# Patient Record
Sex: Female | Born: 1965 | Race: White | Hispanic: No | Marital: Single | Smoking: Current every day smoker
Health system: Southern US, Community
[De-identification: ages and names within clinical notes are randomized; demographics above are authoritative.]

## PROBLEM LIST (undated history)

## (undated) DIAGNOSIS — J449 Chronic obstructive pulmonary disease, unspecified: Secondary | ICD-10-CM

## (undated) DIAGNOSIS — E119 Type 2 diabetes mellitus without complications: Secondary | ICD-10-CM

## (undated) DIAGNOSIS — J45909 Unspecified asthma, uncomplicated: Secondary | ICD-10-CM

## (undated) DIAGNOSIS — I10 Essential (primary) hypertension: Secondary | ICD-10-CM

---

## 2012-10-07 ENCOUNTER — Encounter (HOSPITAL_COMMUNITY): Payer: Self-pay | Admitting: *Deleted

## 2012-10-07 ENCOUNTER — Emergency Department (HOSPITAL_COMMUNITY)
Admission: EM | Admit: 2012-10-07 | Discharge: 2012-10-07 | Disposition: A | Discharge status: Home / Self Care | Payer: Self-pay | Attending: Emergency Medicine | Admitting: Emergency Medicine

## 2012-10-07 ENCOUNTER — Emergency Department (HOSPITAL_COMMUNITY): Payer: Self-pay

## 2012-10-07 DIAGNOSIS — Y9289 Other specified places as the place of occurrence of the external cause: Secondary | ICD-10-CM | POA: Insufficient documentation

## 2012-10-07 DIAGNOSIS — I10 Essential (primary) hypertension: Secondary | ICD-10-CM | POA: Insufficient documentation

## 2012-10-07 DIAGNOSIS — Z880 Allergy status to penicillin: Secondary | ICD-10-CM | POA: Insufficient documentation

## 2012-10-07 DIAGNOSIS — J449 Chronic obstructive pulmonary disease, unspecified: Secondary | ICD-10-CM | POA: Insufficient documentation

## 2012-10-07 DIAGNOSIS — E119 Type 2 diabetes mellitus without complications: Secondary | ICD-10-CM | POA: Insufficient documentation

## 2012-10-07 DIAGNOSIS — J4489 Other specified chronic obstructive pulmonary disease: Secondary | ICD-10-CM | POA: Insufficient documentation

## 2012-10-07 DIAGNOSIS — IMO0002 Reserved for concepts with insufficient information to code with codable children: Secondary | ICD-10-CM | POA: Insufficient documentation

## 2012-10-07 DIAGNOSIS — M549 Dorsalgia, unspecified: Secondary | ICD-10-CM

## 2012-10-07 DIAGNOSIS — Y9389 Activity, other specified: Secondary | ICD-10-CM | POA: Insufficient documentation

## 2012-10-07 DIAGNOSIS — F17200 Nicotine dependence, unspecified, uncomplicated: Secondary | ICD-10-CM | POA: Insufficient documentation

## 2012-10-07 DIAGNOSIS — X503XXA Overexertion from repetitive movements, initial encounter: Secondary | ICD-10-CM | POA: Insufficient documentation

## 2012-10-07 DIAGNOSIS — Y990 Civilian activity done for income or pay: Secondary | ICD-10-CM | POA: Insufficient documentation

## 2012-10-07 HISTORY — DX: Essential (primary) hypertension: I10

## 2012-10-07 HISTORY — DX: Chronic obstructive pulmonary disease, unspecified: J44.9

## 2012-10-07 HISTORY — DX: Type 2 diabetes mellitus without complications: E11.9

## 2012-10-07 HISTORY — DX: Unspecified asthma, uncomplicated: J45.909

## 2012-10-07 MED ORDER — CYCLOBENZAPRINE HCL 10 MG PO TABS: 10.0000 mg | ORAL_TABLET | Freq: Three times a day (TID) | ORAL | Status: AC | PRN

## 2012-10-07 MED ORDER — OXYCODONE-ACETAMINOPHEN 5-325 MG PO TABS
2.0000 | ORAL_TABLET | Freq: Once | ORAL | Status: AC
  Administered 2012-10-07: 2 via ORAL
  Filled 2012-10-07: qty 2

## 2012-10-07 MED ORDER — OXYCODONE-ACETAMINOPHEN 5-325 MG PO TABS: 1.0000 | ORAL_TABLET | ORAL | Status: AC | PRN

## 2012-10-07 NOTE — ED Notes (Signed)
Pt c/o lower back pain today.  While she was working she felt something pop.  Pain since then.  She has some t 12 injury

## 2012-10-07 NOTE — ED Provider Notes (Signed)
CSN: [id number]     Arrival date & time 10/07/12  0120 History   First MD Initiated Contact with Patient 10/07/12 0133     Chief Complaint  Patient presents with  . Back Pain   HPI  History provided by the patient. The patient is a 47 year old female with history of hypertension, diabetes and COPD who presents with complaints of cutely worsened low back pain. Patient is traveling from [facility] for work. She is helping to set up the fair. Patient states that she was bending over and lifting heavy metal posts when she suddenly felt a sharp pain and heard a popping sound in her mid and low back. Since that time she has had severe pain. Injury occurred around 6-7 hours ago. Patient did take 2 Tylenol without any significant improvement of pain. No other treatments attempted. Pain does radiate some to the left lower extremity. 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 She denies any weakness or paralysis of the lower legs. Denies any perineal numbness, urinary retention, urinary or fecal incontinence. Patient states that she had an injury 3 weeks ago resulting in a T12 compression fracture of the spine. She was evaluated for this in [facility] and was given referral to followup with an orthopedic spine specialist but she has not done so due to her working schedule. She states she has had some daily back pains as a result.    Past Medical History  Diagnosis Date  . Hypertension   . Diabetes mellitus without complication   . Asthma   . COPD (chronic obstructive pulmonary disease)    History reviewed. No pertinent past surgical history. No family history on file. History  Substance Use Topics  . Smoking status: Current Every Day Smoker  . Smokeless tobacco: Not on file  . Alcohol Use: No   OB History   Grav Para Term Preterm Abortions TAB SAB Ect Mult Living                 Review of Systems  Constitutional:  Negative for fever.  Respiratory: Negative for shortness of breath.   Gastrointestinal: Negative for nausea and vomiting.  Genitourinary: Negative for dysuria, frequency, hematuria and flank pain.  Musculoskeletal: Positive for back pain.  Neurological: Negative for weakness and numbness.  All other systems reviewed and are negative.    Allergies  Penicillins  Home Medications  No current outpatient prescriptions on file. BP 137/81  Pulse 103  Temp(Src) 99 F (37.2 C) (Oral)  Resp 18  Ht 5\' 7"  (1.702 m)  Wt 205 lb (92.987 kg)  BMI 32.1 kg/m2  SpO2 99% Physical Exam  Nursing note and vitals reviewed. Constitutional: She is oriented to person, place, and time. She appears well-developed and well-nourished. No distress.  HENT:  Head: Normocephalic.  Cardiovascular: Normal rate and regular rhythm.   Pulmonary/Chest: Effort normal and breath sounds normal. No respiratory distress. She has no wheezes. She has no rales.  Abdominal: Soft. There is no tenderness.  Musculoskeletal:       Lumbar back: She exhibits tenderness. She exhibits no bony tenderness.       Back:  Patient also complains of pain to palpation of the lower extremities due to a "neuropathy". No deformities. Normal pulses. Normal and equal strength in the feet and lower legs bilaterally.  Neurological: She is alert and oriented to person, place, and time.  Skin: Skin is warm and dry. No rash  noted.  Psychiatric: She has a normal mood and affect. Her behavior is normal.    ED Course  Procedures   Imaging Review Dg Thoracic Spine W/swimmers  10/07/2012   *RADIOLOGY REPORT*  Clinical Data: Low back pain.  History of T12 injury.  THORACIC SPINE - 2 VIEW + SWIMMERS  Comparison: None.  Findings: Mild thoracolumbar curvature convex towards the left, likely positional. Normal alignment of the thoracic spine.  Old appearing compression deformity of T12.  No additional compression deformities.  No focal bone lesion or  bone destruction.  Bone cortex and trabecular architecture appear intact.  No paraspinal soft tissue swelling.  IMPRESSION: Old compression deformity of T12.  Normal alignment of the thoracic spine.  No acute fractures demonstrated.   Original Report Authenticated By: Burman Nieves, M.D.   Dg Lumbar Spine Complete  10/07/2012   *RADIOLOGY REPORT*  Clinical Data: Back pain.  Injury lifting poles.  History of T12 injury.  LUMBAR SPINE - COMPLETE 4+ VIEW  Comparison: None.  Findings: Four lumbar type vertebral bodies with sacralized L5. Lumbar convexity towards the left may be positional or degenerative.  Normal alignment of the facet joints.  Chronic- appearing anterior compression of T12.  No additional compression deformities.  Degenerative disc disease at L4-5.  No focal bone lesion or bone destruction.  IMPRESSION: Old anterior compression deformity of T12.  Degenerative changes in the lumbar spine.  No displaced fractures identified.   Original Report Authenticated By: Burman Nieves, M.D.    MDM   1. Back pain       2:05 AM patient seen and evaluated. Patient appears uncomfortable no acute distress. Secretary is working on obtaining recent x-ray results from [place].   Medical records were obtained from recent visits to [facility] in [place]. Patient was seen on September 2 for complaints of back pain. There is no documents showing any thoracic spine x-rays. She did have a chest x-ray that did not mention any compression fractures. Patient was given prescriptions for oxycodone 5/325 x 30 tabs. There was mention of her past history of polysubstance abuse however provider at that time felt there was no specific drug seeking behavior and so a narcotic prescription was provided to help with her back pains.  Plan to obtain x-rays here for evaluation.  Angus Seller, PA-C 10/07/12 [phone]

## 2012-10-12 ENCOUNTER — Emergency Department (HOSPITAL_COMMUNITY)
Admission: EM | Admit: 2012-10-12 | Discharge: 2012-10-13 | Disposition: A | Discharge status: Home / Self Care | Payer: Self-pay | Attending: Emergency Medicine | Admitting: Emergency Medicine

## 2012-10-12 ENCOUNTER — Encounter (HOSPITAL_COMMUNITY): Payer: Self-pay | Admitting: Emergency Medicine

## 2012-10-12 DIAGNOSIS — IMO0002 Reserved for concepts with insufficient information to code with codable children: Secondary | ICD-10-CM | POA: Insufficient documentation

## 2012-10-12 DIAGNOSIS — R600 Localized edema: Secondary | ICD-10-CM

## 2012-10-12 DIAGNOSIS — Z792 Long term (current) use of antibiotics: Secondary | ICD-10-CM | POA: Insufficient documentation

## 2012-10-12 DIAGNOSIS — M545 Low back pain, unspecified: Secondary | ICD-10-CM | POA: Insufficient documentation

## 2012-10-12 DIAGNOSIS — E669 Obesity, unspecified: Secondary | ICD-10-CM | POA: Insufficient documentation

## 2012-10-12 DIAGNOSIS — R609 Edema, unspecified: Secondary | ICD-10-CM | POA: Insufficient documentation

## 2012-10-12 DIAGNOSIS — I10 Essential (primary) hypertension: Secondary | ICD-10-CM | POA: Insufficient documentation

## 2012-10-12 DIAGNOSIS — J4489 Other specified chronic obstructive pulmonary disease: Secondary | ICD-10-CM | POA: Insufficient documentation

## 2012-10-12 DIAGNOSIS — G8929 Other chronic pain: Secondary | ICD-10-CM | POA: Insufficient documentation

## 2012-10-12 DIAGNOSIS — F17200 Nicotine dependence, unspecified, uncomplicated: Secondary | ICD-10-CM | POA: Insufficient documentation

## 2012-10-12 DIAGNOSIS — E119 Type 2 diabetes mellitus without complications: Secondary | ICD-10-CM | POA: Insufficient documentation

## 2012-10-12 DIAGNOSIS — J449 Chronic obstructive pulmonary disease, unspecified: Secondary | ICD-10-CM | POA: Insufficient documentation

## 2012-10-12 DIAGNOSIS — Z79899 Other long term (current) drug therapy: Secondary | ICD-10-CM | POA: Insufficient documentation

## 2012-10-12 DIAGNOSIS — Z880 Allergy status to penicillin: Secondary | ICD-10-CM | POA: Insufficient documentation

## 2012-10-12 NOTE — ED Notes (Signed)
Pt presents with left leg swelling onset today- denies any injury.  Ambulatory in triage.

## 2012-10-13 ENCOUNTER — Emergency Department (HOSPITAL_COMMUNITY): Payer: Self-pay

## 2012-10-13 LAB — POCT I-STAT, CHEM 8
Creatinine, Ser: 0.9 mg/dL (ref 0.50–1.10)
Hemoglobin: 10.5 g/dL — ABNORMAL LOW (ref 12.0–15.0)
Potassium: 3.1 mEq/L — ABNORMAL LOW (ref 3.5–5.1)
Sodium: 140 mEq/L (ref 135–145)
TCO2: 27 mmol/L (ref 0–100)

## 2012-10-13 MED ORDER — OXYCODONE-ACETAMINOPHEN 5-325 MG PO TABS
2.0000 | ORAL_TABLET | Freq: Once | ORAL | Status: AC
  Administered 2012-10-13: 2 via ORAL
  Filled 2012-10-13: qty 2

## 2012-10-13 MED ORDER — KETOROLAC TROMETHAMINE 60 MG/2ML IM SOLN
60.0000 mg | Freq: Once | INTRAMUSCULAR | Status: AC
  Administered 2012-10-13: 60 mg via INTRAMUSCULAR
  Filled 2012-10-13: qty 2

## 2012-10-13 MED ORDER — ENOXAPARIN SODIUM 100 MG/ML ~~LOC~~ SOLN
95.0000 mg | Freq: Once | SUBCUTANEOUS | Status: AC
  Administered 2012-10-13: 95 mg via SUBCUTANEOUS
  Filled 2012-10-13: qty 1

## 2012-10-13 NOTE — ED Notes (Signed)
Pt denies any questions upon discharge. 

## 2012-10-13 NOTE — ED Provider Notes (Signed)
CSN: [id number]     Arrival date & time 10/12/12  2335 History   First MD Initiated Contact with Patient 10/12/12 2343     Chief Complaint  Patient presents with  . Leg Pain   (Consider location/radiation/quality/duration/timing/severity/associated sxs/prior Treatment) HPI History provided by pt.   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  Associated w/ edema of left lower leg and ankle.  Denies fever, bowel/bladder dysfunction and LE weakness/paresthesias.  Denies trauma but reports that she was diagnosed w/ a spontaneous thoracic spine compression fx 1 month ago.  RF for DVT include frequent travel.  Has chronic low back pain but it has never felt like this; more severe and sharp and has never been associated w/ edema.  Per prior chart, pt seen on 9/11 for low back pain as well.  [place], PA-C obtained records from [facility] in [place]; pt evaluated for low back pain 09/28/12, there was no mention of thoracic spine xrays but CXR did not show compression fx, given script for 30 percocet despite mention of h/o polysubstance abuse.   Past Medical History  Diagnosis Date  . Hypertension   . Diabetes mellitus without complication   . Asthma   . COPD (chronic obstructive pulmonary disease)    History reviewed. No pertinent past surgical history. No family history on file. History  Substance Use Topics  . Smoking status: Current Every Day Smoker  . Smokeless tobacco: Not on file  . Alcohol Use: No   OB History   Grav Para Term Preterm Abortions TAB SAB Ect Mult Living                 Review of Systems  All other systems reviewed and are negative.    Allergies  Penicillins  Home Medications   Current Outpatient Rx  Name  Route  Sig  Dispense  Refill  . albuterol (PROVENTIL HFA;VENTOLIN HFA) 108 (90 BASE) MCG/ACT inhaler   Inhalation  Inhale 2 puffs into the lungs every 6 (six) hours as needed for wheezing or shortness of breath.         . cyclobenzaprine (FLEXERIL) 10 MG tablet   Oral   Take 1 tablet (10 mg total) by mouth 3 (three) times daily as needed for muscle spasms.   30 tablet   0   . Fluticasone-Salmeterol (ADVAIR) 500-50 MCG/DOSE AEPB   Inhalation   Inhale 1 puff into the lungs every 12 (twelve) hours.         . gabapentin (NEURONTIN) 300 MG capsule   Oral   Take 600 mg by mouth 3 (three) times daily.         Marland Kitchen lisinopril-hydrochlorothiazide (PRINZIDE,ZESTORETIC) 20-25 MG per tablet   Oral   Take 1 tablet by mouth daily.         . metFORMIN (GLUCOPHAGE) 500 MG tablet   Oral   Take 500 mg by mouth 2 (two) times daily with a meal.         . oxyCODONE-acetaminophen (PERCOCET) 5-325 MG per tablet   Oral   Take 1 tablet by mouth every 4 (four) hours as needed for pain.   10 tablet   0   . potassium chloride SA (K-DUR,KLOR-CON) 20 MEQ tablet   Oral   Take 40 mEq by mouth 2 (two) times daily.         Marland Kitchen  tiotropium (SPIRIVA) 18 MCG inhalation capsule   Inhalation   Place 18 mcg into inhaler and inhale daily.         Marland Kitchen doxycycline (VIBRA-TABS) 100 MG tablet   Oral   Take 100 mg by mouth 2 (two) times daily.          BP 142/78  Pulse 102  Temp(Src) 97.6 F (36.4 C) (Oral)  Resp 18  SpO2 99% Physical Exam  Nursing note and vitals reviewed. Constitutional: She is oriented to person, place, and time. She appears well-developed and well-nourished.  obese  HENT:  Head: Normocephalic and atraumatic.  Eyes:  Normal appearance  Neck: Normal range of motion.  Cardiovascular: Normal rate and regular rhythm.   Pulmonary/Chest: Effort normal and breath sounds normal.  Genitourinary:  No CVA ttp  Musculoskeletal:  Lower thoracic and lumbar spinal and paraspinal ttp. Full active ROM of LE.  Nml patellar reflexes.  No saddle anesthesia. Distal sensation intact.  Edema L calf.   Tenderness calf and entire L foot and ankle.  Pain w/ passive ROM of ankle.  2+ DP pulses.    Neurological: She is alert and oriented to person, place, and time.  Skin: Skin is warm and dry. No rash noted.  Psychiatric: She has a normal mood and affect. Her behavior is normal.    ED Course  Procedures (including critical care time) Labs Review Labs Reviewed  POCT I-STAT, CHEM 8 - Abnormal; Notable for the following:    Potassium 3.1 (*)    BUN <3 (*)    Glucose, Bld 140 (*)    Calcium, Ion 1.26 (*)    Hemoglobin 10.5 (*)    HCT 31.0 (*)    All other components within normal limits   Imaging Review Dg Thoracic Spine W/swimmers  10/13/2012   CLINICAL DATA:  Low back pain. No known injury.  EXAM: THORACIC SPINE - 2 VIEW + SWIMMERS  COMPARISON:  10/07/2012.  FINDINGS: Unchanged appearance of remote T12 compression deformity, with less than 50% height loss. No acute fracture or subluxation detected. No focally advanced degenerative disc narrowing.  IMPRESSION: 1. Negative for acute osseous injury. 2. Remote T12 compression fracture.   Electronically Signed   By: Tiburcio Pea   On: 10/13/2012 02:12   Dg Lumbar Spine Complete  10/13/2012   CLINICAL DATA:  Low back pain. No known injury  EXAM: LUMBAR SPINE - COMPLETE 4+ VIEW  COMPARISON:  10/07/2012  FINDINGS: Remote T12 compression fracture with less than 50% height loss. No subluxation. No acute fracture detected.  Transitional lumbosacral anatomy with partly sacralized L5, which has an articulation between the right transverse process and sacrum. L4-5 degenerative disc disease with narrowing and endplate sclerosis.  IMPRESSION: 1. No evidence of acute osseous injury. 2. Remote T12 compression fracture.   Electronically Signed   By: Tiburcio Pea   On: 10/13/2012 02:11    MDM   1. Leg edema, left   2. Chronic low back pain    46yo F presents w/ acute on chronic, non-traumatic low back pain.  Reports that the pain is more severe and  sharp than typical and for the first time, it is associated w/ LLE edema.  Also reports non-traumatic thoracic compression fractures one month ago, diagnosed w/ [place].  For this reason, as well as tenderness of thoracic and lumbar spinal tenderness on exam today, xrays ordered and are currently in process.  I realized afterwards that patient was evaluated for same in ED on  10/07/12, xrays were obtained at that time and were non-acute.  Pt failed to mention.  Past medical records reviewed at last visit showed that patient has had multiple recent visits for back pain in [place] and that she has a h/o polysubstance abuse.  She has no NV deficits on her exam and she is ambulatory. She received a dose of IM toradol, which she was unhappy with, as well as 2 percocet for pain, and I will d/c home w/out analgesics.  She did receive a dose of lovenox in ED because exam concerning for DVT LLE and pt at risk w/ recent travel.  Will order venous duplex for am, though I doubt patient will return.  I explained the risks of her not having this study performed, including PE and death.  1:56 AM   Xrays non-acute.  Results discussed w/ pt.  I explained to her why I will not be sending her home w/ narcotics.  Her response was, "well then I'm just going to end up back in here tomorrow".  She reports no relief w/ toradol and percocet.  I reminded her of importance of returning for LE doppler tomorrow.  Exam has been ordered.   She will return sooner for CP/SOB.    Otilio Miu, PA-C 10/13/12 [phone]

## 2012-10-13 NOTE — ED Provider Notes (Signed)
Medical screening examination/treatment/procedure(s) were performed by non-physician practitioner and as supervising physician I was immediately available for consultation/collaboration.   Lyanne Co, MD 10/13/12 2107

## 2012-10-14 NOTE — ED Provider Notes (Signed)
Medical screening examination/treatment/procedure(s) were performed by non-physician practitioner and as supervising physician I was immediately available for consultation/collaboration.   Genene Kilman, MD 10/14/12 0653 

## 2012-10-15 ENCOUNTER — Emergency Department (HOSPITAL_COMMUNITY)
Admission: EM | Admit: 2012-10-15 | Discharge: 2012-10-15 | Discharge status: Against Medical Advice | Payer: Self-pay | Attending: Emergency Medicine | Admitting: Emergency Medicine

## 2012-10-15 DIAGNOSIS — R079 Chest pain, unspecified: Secondary | ICD-10-CM | POA: Insufficient documentation

## 2012-10-15 DIAGNOSIS — F17200 Nicotine dependence, unspecified, uncomplicated: Secondary | ICD-10-CM | POA: Insufficient documentation

## 2012-10-15 NOTE — ED Notes (Signed)
Arrived via EMS, Chest pain starting this evening about ago. Described as pressure mid chest radiates towards left arm. Previous hx of Septal infarction.

## 2012-10-15 NOTE — ED Notes (Signed)
IV access attempted x2 without success. Patient also had 2 attempts via EMS.

## 2012-10-15 NOTE — ED Notes (Signed)
IV team called to attempt IV access.

## 2014-10-16 IMAGING — CR DG THORACIC SPINE 3V
3 series · 3 of 3 positions shown · non-contrast
Comparison: None.

CLINICAL DATA: Low back pain.  History of T12 injury.

THORACIC SPINE - 2 VIEW + SWIMMERS

[t thoracic spine ap]
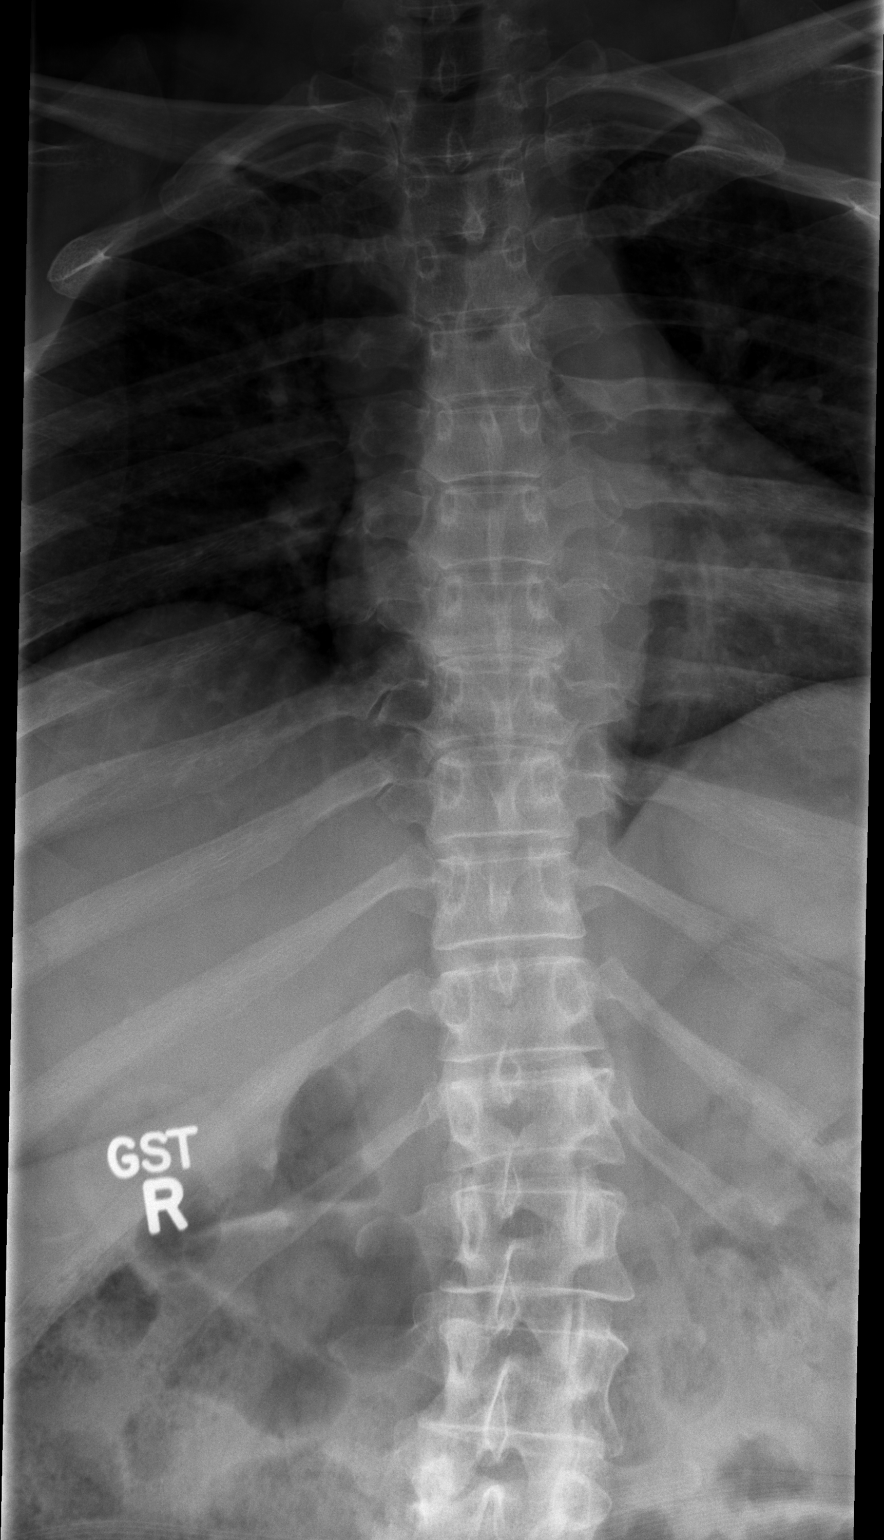

[t thoracic spine lat]
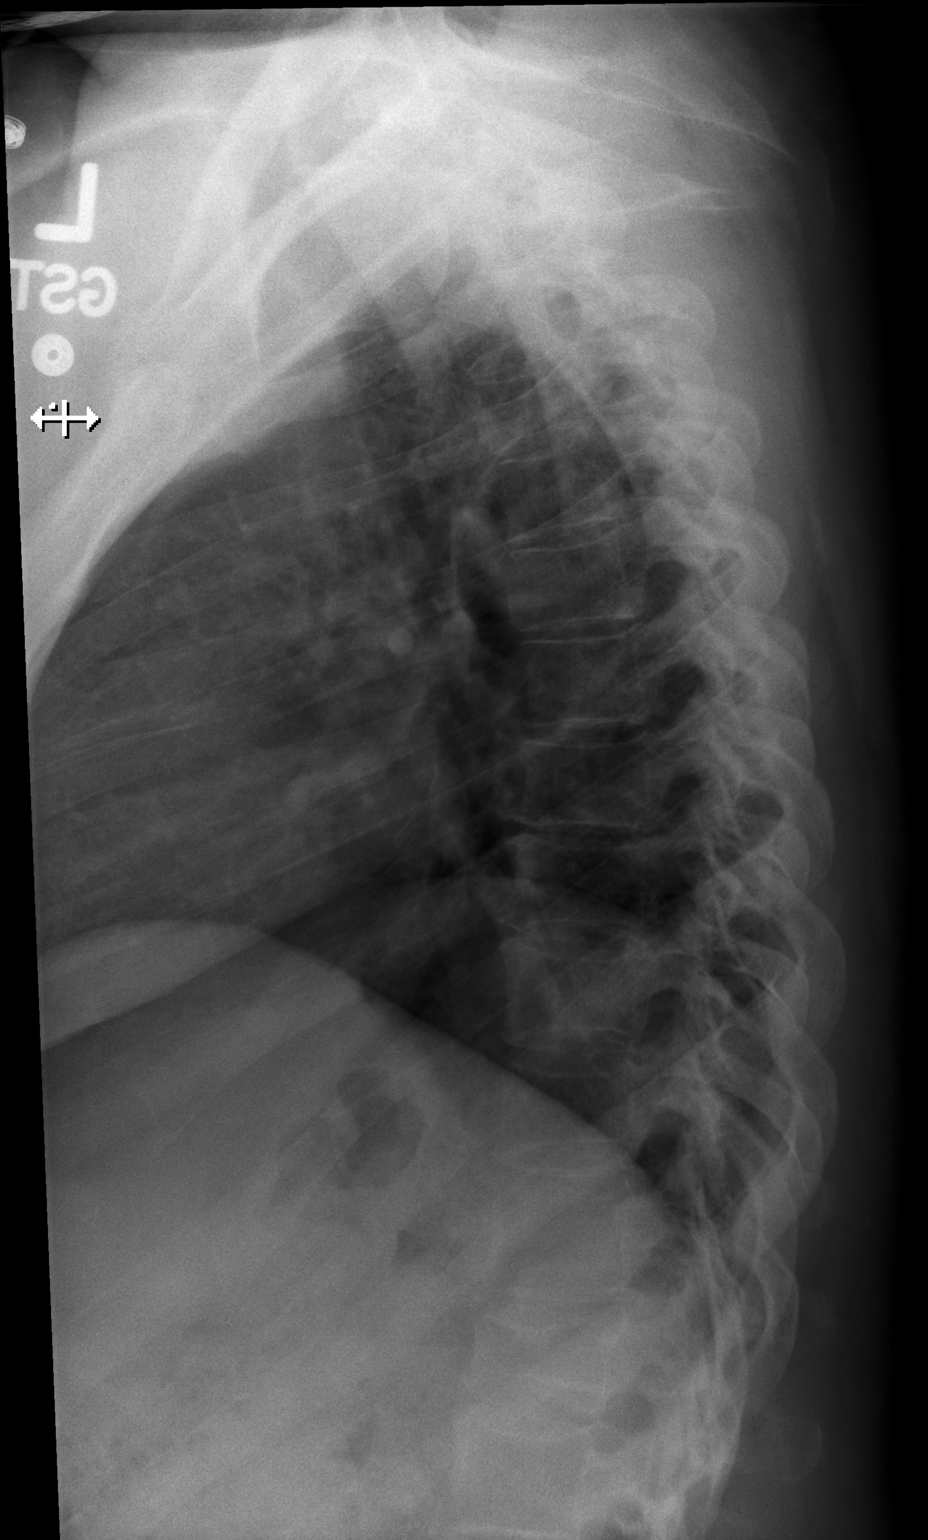

[t thoracic swimmers]
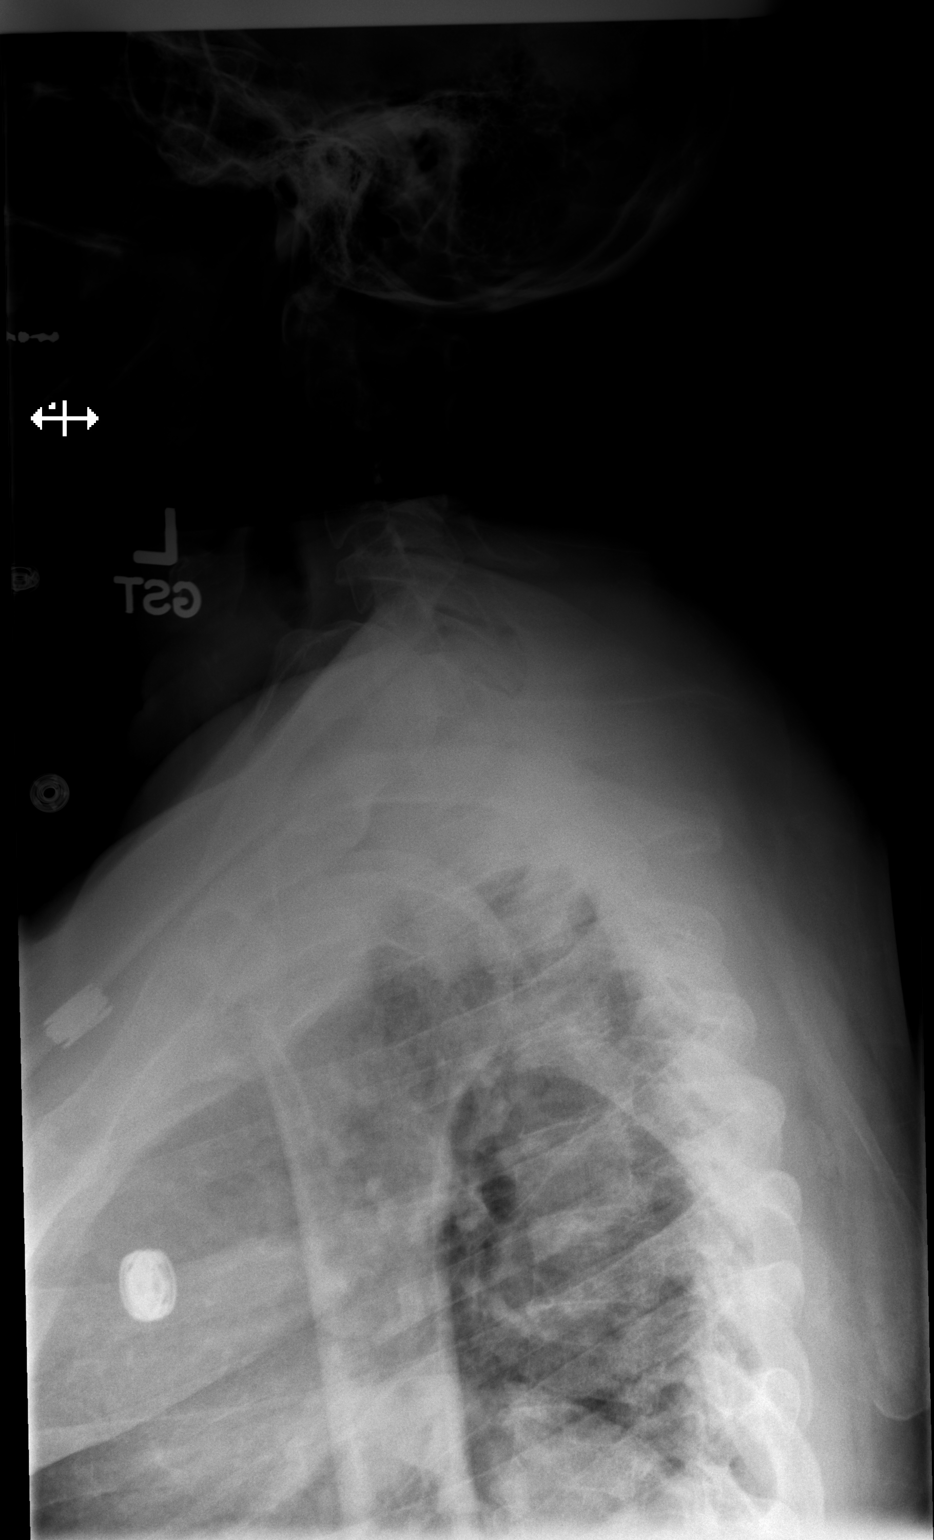

[3 of 3 positions shown; findings below may reference images not displayed]

FINDINGS: Mild thoracolumbar curvature convex towards the left,
likely positional. Normal alignment of the thoracic spine.  Old
appearing compression deformity of T12.  No additional compression
deformities.  No focal bone lesion or bone destruction.  Bone
cortex and trabecular architecture appear intact.  No paraspinal
soft tissue swelling.
IMPRESSION: Old compression deformity of T12.  Normal alignment of the thoracic
spine.  No acute fractures demonstrated.

## 2014-10-22 IMAGING — CR DG LUMBAR SPINE COMPLETE 4+V
5 series · 5 of 5 positions shown · non-contrast
Comparison: 10/07/2012

CLINICAL DATA: Low back pain. No known injury

EXAM:
LUMBAR SPINE - COMPLETE 4+ VIEW

[t lumbar spine ap]
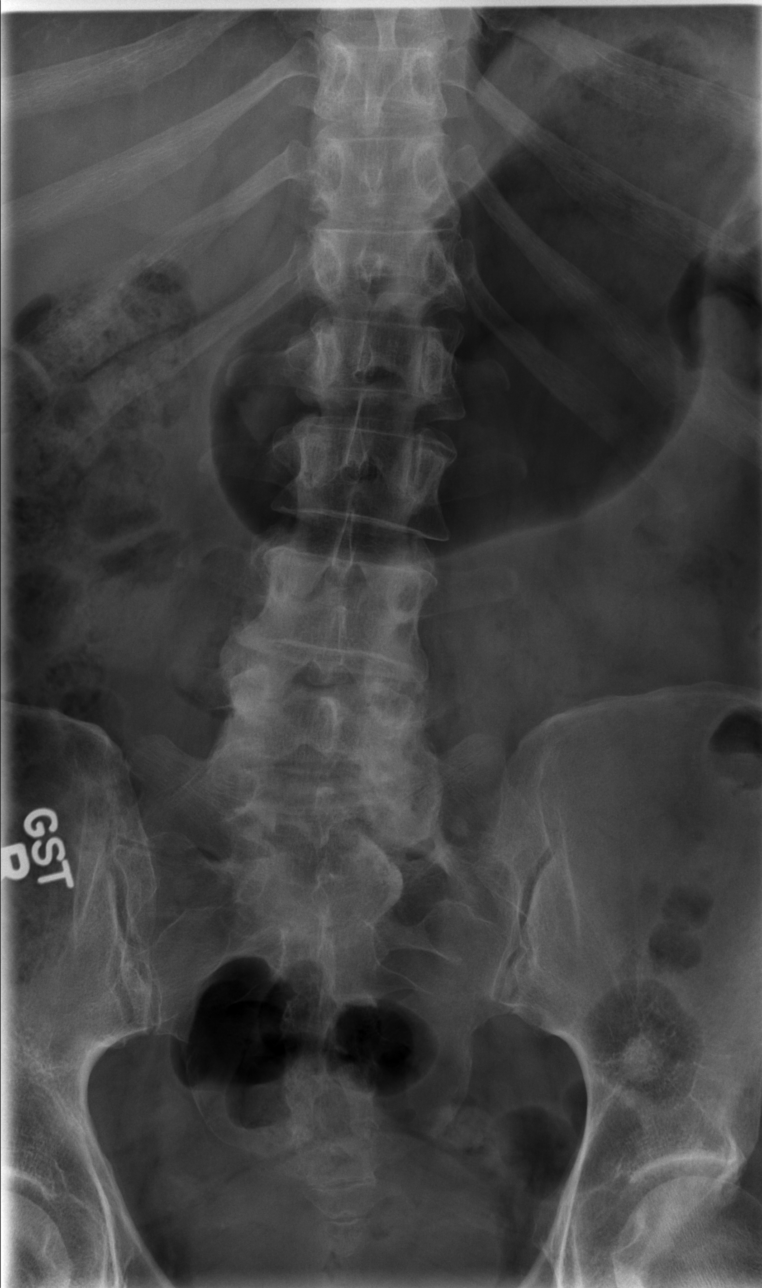

[t lumbar spine obl (1 of 2)]
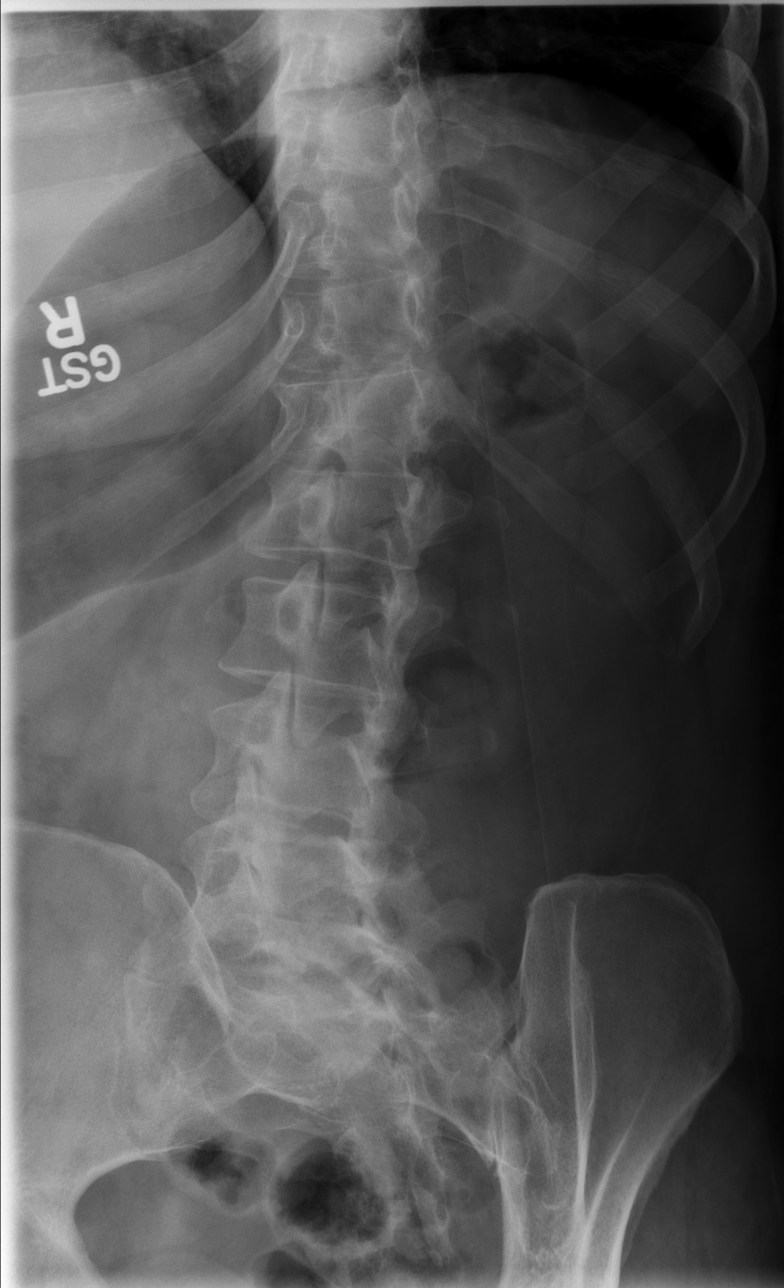

[t lumbar spine obl (2 of 2)]
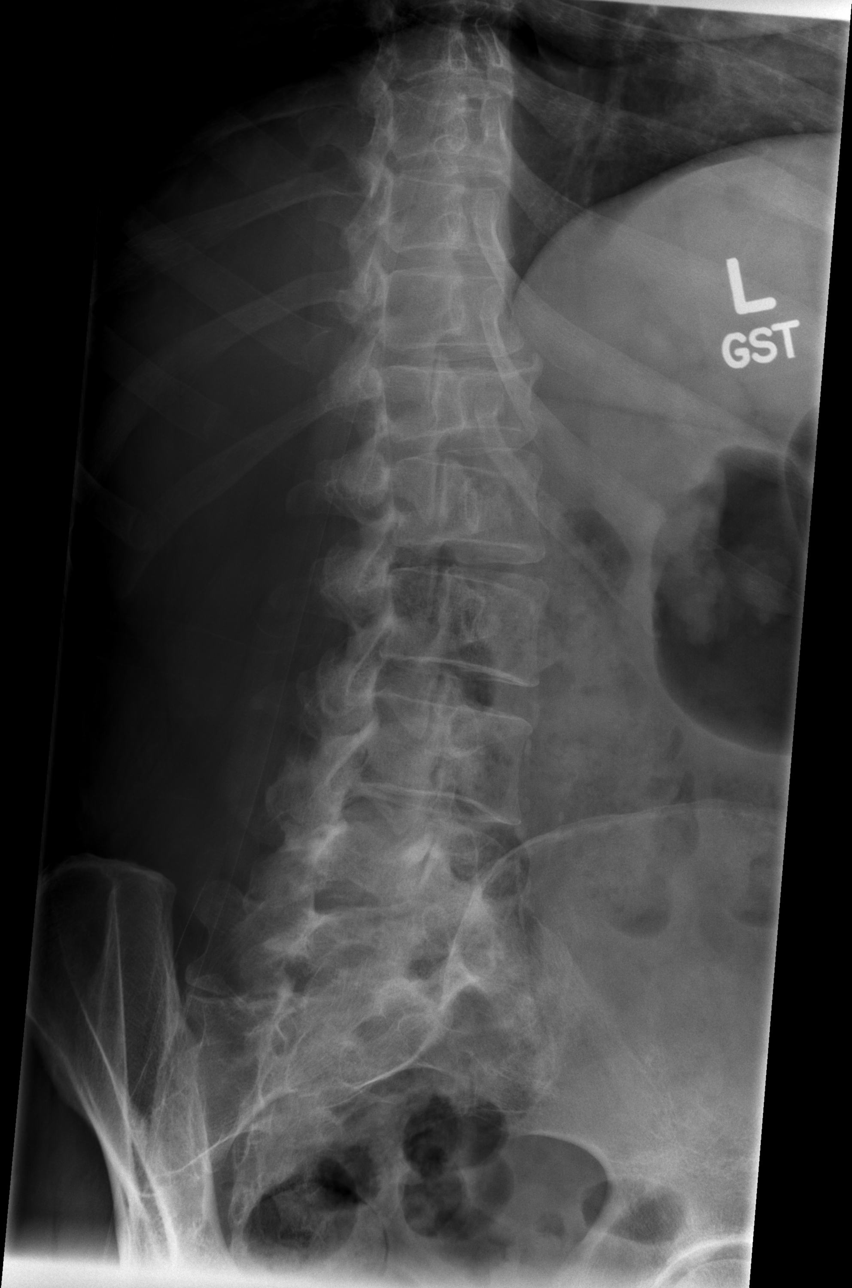

[t lumbar spine lat]
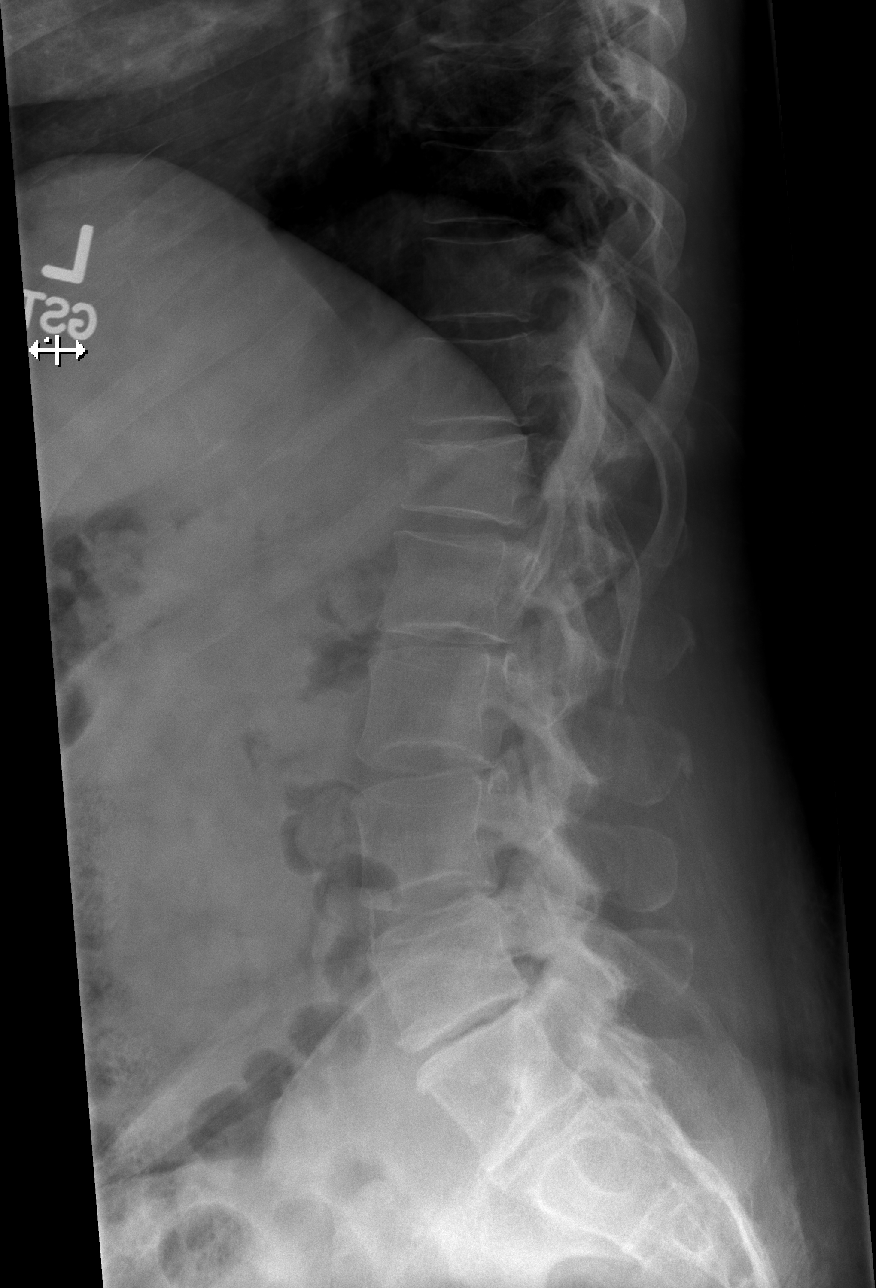

[t lumbar l-5 s-1 spot]
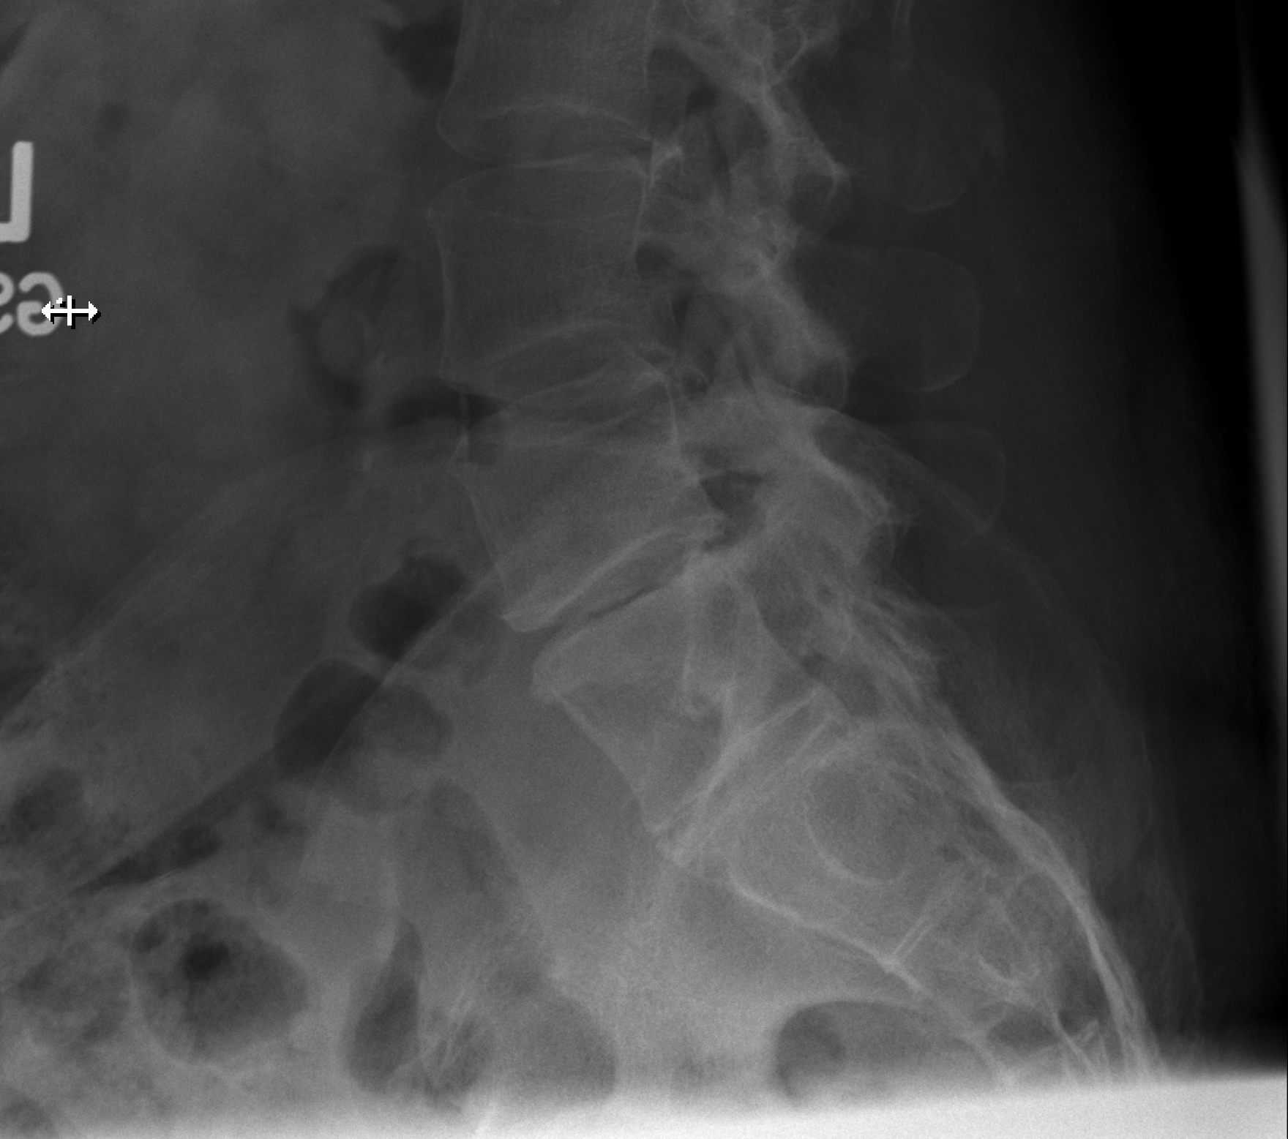

[5 of 5 positions shown; findings below may reference images not displayed]

FINDINGS: Remote T12 compression fracture with less than 50% height loss. No
subluxation. No acute fracture detected.

Transitional lumbosacral anatomy with partly sacralized L5, which
has an articulation between the right transverse process and sacrum.
L4-5 degenerative disc disease with narrowing and endplate
sclerosis.
IMPRESSION: 1. No evidence of acute osseous injury.
2. Remote T12 compression fracture.

## 2023-07-28 DEATH — deceased
